# Patient Record
Sex: Male | Born: 2000 | Race: Black or African American | Hispanic: No | Marital: Single | Smoking: Never smoker
Health system: Southern US, Community
[De-identification: ages and names within clinical notes are randomized; demographics above are authoritative.]

---
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
## 2019-02-18 ENCOUNTER — Emergency Department (HOSPITAL_COMMUNITY): Payer: Medicaid Other

## 2019-02-18 ENCOUNTER — Emergency Department (HOSPITAL_COMMUNITY)
Admission: EM | Admit: 2019-02-18 | Discharge: 2019-02-18 | Discharge status: Absent without leave | Payer: Medicaid Other

## 2019-02-18 ENCOUNTER — Emergency Department (HOSPITAL_COMMUNITY)
Admission: EM | Admit: 2019-02-18 | Discharge: 2019-02-18 | Disposition: A | Discharge status: Home / Self Care | Payer: Medicaid Other | Attending: Emergency Medicine | Admitting: Emergency Medicine

## 2019-02-18 ENCOUNTER — Other Ambulatory Visit: Payer: Self-pay

## 2019-02-18 DIAGNOSIS — N130 Hydronephrosis with ureteropelvic junction obstruction: Secondary | ICD-10-CM | POA: Diagnosis not present

## 2019-02-18 DIAGNOSIS — N200 Calculus of kidney: Secondary | ICD-10-CM

## 2019-02-18 DIAGNOSIS — R1031 Right lower quadrant pain: Secondary | ICD-10-CM | POA: Diagnosis present

## 2019-02-18 DIAGNOSIS — N134 Hydroureter: Secondary | ICD-10-CM | POA: Diagnosis not present

## 2019-02-18 LAB — CBC WITH DIFFERENTIAL/PLATELET
Abs Immature Granulocytes: 0.03 10*3/uL (ref 0.00–0.07)
Basophils Absolute: 0 10*3/uL (ref 0.0–0.1)
Basophils Relative: 0 %
Eosinophils Absolute: 0 10*3/uL (ref 0.0–0.5)
Eosinophils Relative: 0 %
HCT: 45.8 % (ref 39.0–52.0)
Hemoglobin: 16 g/dL (ref 13.0–17.0)
Immature Granulocytes: 0 %
Lymphocytes Relative: 13 %
Lymphs Abs: 0.9 10*3/uL (ref 0.7–4.0)
MCH: 33.1 pg (ref 26.0–34.0)
MCHC: 34.9 g/dL (ref 30.0–36.0)
MCV: 94.6 fL (ref 80.0–100.0)
Monocytes Absolute: 0.4 10*3/uL (ref 0.1–1.0)
Monocytes Relative: 6 %
Neutro Abs: 5.4 10*3/uL (ref 1.7–7.7)
Neutrophils Relative %: 81 %
Platelets: 291 10*3/uL (ref 150–400)
RBC: 4.84 MIL/uL (ref 4.22–5.81)
RDW: 11 % — ABNORMAL LOW (ref 11.5–15.5)
WBC: 6.7 10*3/uL (ref 4.0–10.5)
nRBC: 0 % (ref 0.0–0.2)

## 2019-02-18 LAB — URINALYSIS, ROUTINE W REFLEX MICROSCOPIC
Bilirubin Urine: NEGATIVE
Glucose, UA: NEGATIVE mg/dL
Ketones, ur: NEGATIVE mg/dL
Leukocytes,Ua: NEGATIVE
Nitrite: NEGATIVE
Protein, ur: 30 mg/dL — AB
RBC / HPF: >50 RBC/hpf — ABNORMAL HIGH (ref 0–5)
Specific Gravity, Urine: 1.006 (ref 1.005–1.030)
pH: 7 (ref 5.0–8.0)

## 2019-02-18 LAB — COMPREHENSIVE METABOLIC PANEL
ALT: 10 U/L (ref 0–44)
AST: 17 U/L (ref 15–41)
Albumin: 4.4 g/dL (ref 3.5–5.0)
Alkaline Phosphatase: 74 U/L (ref 38–126)
Anion gap: 9 (ref 5–15)
BUN: 11 mg/dL (ref 6–20)
CO2: 26 mmol/L (ref 22–32)
Calcium: 9.7 mg/dL (ref 8.9–10.3)
Chloride: 105 mmol/L (ref 98–111)
Creatinine, Ser: 1.16 mg/dL (ref 0.61–1.24)
GFR calc Af Amer: >60 mL/min (ref >60)
GFR calc non Af Amer: >60 mL/min (ref >60)
Glucose, Bld: 91 mg/dL (ref 70–99)
Potassium: 3.5 mmol/L (ref 3.5–5.1)
Sodium: 140 mmol/L (ref 135–145)
Total Bilirubin: 2.2 mg/dL — ABNORMAL HIGH (ref 0.3–1.2)
Total Protein: 7.6 g/dL (ref 6.5–8.1)

## 2019-02-18 LAB — LIPASE, BLOOD: Lipase: 33 U/L (ref 11–51)

## 2019-02-18 MED ORDER — IOHEXOL 300 MG/ML  SOLN
100.0000 mL | Freq: Once | INTRAMUSCULAR | Status: AC | PRN
  Administered 2019-02-18: 100 mL via INTRAVENOUS

## 2019-02-18 MED ORDER — NAPROXEN 500 MG PO TABS: 500.0000 mg | ORAL_TABLET | Freq: Two times a day (BID) | ORAL | 0 refills | Status: AC | PRN

## 2019-02-18 MED ORDER — SODIUM CHLORIDE 0.9 % IV BOLUS
1000.0000 mL | Freq: Once | INTRAVENOUS | Status: AC
  Administered 2019-02-18: 1000 mL via INTRAVENOUS

## 2019-02-18 MED ORDER — TAMSULOSIN HCL 0.4 MG PO CAPS: 0.4000 mg | ORAL_CAPSULE | Freq: Every day | ORAL | 0 refills | Status: AC

## 2019-02-18 MED ORDER — ONDANSETRON 4 MG PO TBDP: 4.0000 mg | ORAL_TABLET | Freq: Three times a day (TID) | ORAL | 0 refills | Status: AC | PRN

## 2019-02-18 MED ORDER — ONDANSETRON HCL 4 MG/2ML IJ SOLN
4.0000 mg | Freq: Once | INTRAMUSCULAR | Status: AC
  Administered 2019-02-18: 4 mg via INTRAVENOUS
  Filled 2019-02-18: qty 2

## 2019-02-18 MED ORDER — MORPHINE SULFATE (PF) 4 MG/ML IV SOLN
4.0000 mg | Freq: Once | INTRAVENOUS | Status: AC
  Administered 2019-02-18: 4 mg via INTRAVENOUS
  Filled 2019-02-18: qty 1

## 2019-02-18 MED ORDER — OXYCODONE-ACETAMINOPHEN 5-325 MG PO TABS: 1.0000 | ORAL_TABLET | Freq: Three times a day (TID) | ORAL | 0 refills | Status: AC | PRN

## 2019-02-18 NOTE — ED Triage Notes (Signed)
Pt BIB [facility] for abdominal pain that started this morning when he woke up. Pt reports that the pain worsens with standing up. Denies any nausea, vomiting, diarrhea.

## 2019-02-18 NOTE — ED Notes (Signed)
Pt called from lobby with no response x1 

## 2019-02-18 NOTE — Discharge Instructions (Addendum)
You were seen in the emergency department and found to have a kidney stone.  We are sending you home with multiple medications to assist with passing the stone:   -Flomax-this is a medication to help pass the stone, it allows urine to exit the body more freely.  Please take this once daily with a meal.  -Naproxen 500 mg-this is a medication that will help with pain as well as passing the stone.  Please take this every 8 hours.  Take this with food as it can cause stomach upset and at worst stomach bleeding.  Do not take other NSAIDs such as Motrin, Aleve, Advil, Mobic, or ibuprofen with this medicine as they are similar and would propagate any potential side effects.   -Percocet-this is a narcotic/controlled substance medication that has potential addicting qualities.  We recommend that you take 1 tablets every 8 hours as needed for severe pain.  Do not drive or operate heavy machinery when taking this medicine as it can be sedating. Do not drink alcohol or take other sedating medications when taking this medicine for safety reasons.  Keep this out of reach of small children.  Please be aware this medicine has Tylenol in it (325 mg/tab) do not exceed the maximum dose of Tylenol in a day per over the counter recommendations should you decide to supplement with Tylenol over the counter.   -Zofran-this is an antinausea medication, you may take this every 8 hours as needed for nausea and vomiting, please allow the tablet to dissolve underneath of your tongue.   We have prescribed you new medication(s) today. Discuss the medications prescribed today with your pharmacist as they can have adverse effects and interactions with your other medicines including over the counter and prescribed medications. Seek medical evaluation if you start to experience new or abnormal symptoms after taking one of these medicines, seek care immediately if you start to experience difficulty breathing, feeling of your throat closing,  facial swelling, or rash as these could be indications of a more serious allergic reaction  Please follow-up with the urology group provided in your discharge instructions within 3 to 5 days.  Return to the ER for new or worsening symptoms including but not limited to worsening pain not controlled by these medicines, inability to keep fluids down, fever, or any other concerns that you may have.

## 2019-02-18 NOTE — ED Provider Notes (Signed)
[facility]   CSN: [id number] Arrival date & time: 02/18/19  1157     History   Chief Complaint Chief Complaint  Patient presents with   Abdominal Pain    HPI Duane Doyle is a 18 y.o. male no known past medical history presents to emergency department today with chief complaint of abdominal pain, onset was acute starting approximately 3 hours prior to arrival.  Patient states the pain woke him up from his sleep.  He describes the pain as sharp.  Pain is located in right lower quadrant and radiates throughout abdomen.  He rates the pain 8 of 10 in severity.  He states pain is worse with walking and standing up straight.  Did not take anything for pain prior to arrival.  Pain has been constant.  Denies fever, chills, chest pain, shortness of breath, nausea, vomiting, urinary frequency, gross hematuria, diarrhea.  Last p.o. intake was 2 AM this morning.  Denies abdominal surgical history, also denies history of kidney stones.   No past medical history on file.  There are no active problems to display for this patient.   No past surgical history on file.      Home Medications    Prior to Admission medications   Medication Sig Start Date End Date Taking? Authorizing Provider  cetirizine (ZYRTEC) 10 MG chewable tablet Chew 10 mg by mouth daily.    [provider]  ibuprofen (ADVIL,MOTRIN) 800 MG tablet Take 1 tablet (800 mg total) by mouth every 8 (eight) hours as needed for mild pain or moderate pain. 08/14/16   Trixie DredgeWest, Emily, PA-C  naproxen (NAPROSYN) 500 MG tablet Take 1 tablet (500 mg total) by mouth 2 (two) times daily as needed for up to 14 days. 02/18/19 03/04/19  Audrick Lamoureaux E, PA-C  ondansetron (ZOFRAN ODT) 4 MG disintegrating tablet Take 1 tablet (4 mg total) by mouth every 8 (eight) hours as needed for nausea or vomiting. 02/18/19   Kayliee Atienza E, PA-C  oxyCODONE-acetaminophen (PERCOCET/ROXICET) 5-325  MG tablet Take 1 tablet by mouth every 8 (eight) hours as needed for severe pain. 02/18/19   Alwaleed Obeso E, PA-C  tamsulosin (FLOMAX) 0.4 MG CAPS capsule Take 1 capsule (0.4 mg total) by mouth daily after breakfast for 7 days. 02/18/19 02/25/19  Macsen Nuttall, Caroleen HammanKaitlyn E, PA-C    Family History No family history on file.  Social History Social History   Tobacco Use   Smoking status: Never Smoker   Smokeless tobacco: Never Used  Substance Use Topics   Alcohol use: No   Drug use: Yes    Types: Marijuana     Allergies   Patient has no known allergies.   Review of Systems Review of Systems  Constitutional: Negative for chills and fever.  HENT: Negative for congestion, rhinorrhea, sinus pressure and sore throat.   Eyes: Negative for pain and redness.  Respiratory: Negative for cough, shortness of breath and wheezing.   Cardiovascular: Negative for chest pain and palpitations.  Gastrointestinal: Positive for abdominal pain. Negative for constipation, diarrhea, nausea and vomiting.  Genitourinary: Negative for dysuria.  Musculoskeletal: Negative for arthralgias, back pain, myalgias and neck pain.  Skin: Negative for rash and wound.  Neurological: Negative for dizziness, syncope, weakness, numbness and headaches.  Psychiatric/Behavioral: Negative for confusion.     Physical Exam Updated Vital Signs BP 129/74 (BP Location: Right Arm)    Pulse 64    Temp 97.8 F (36.6 C) (Oral)  Resp 16    Ht 5\' 9"  (1.753 m)    Wt 51.7 kg    SpO2 99%    BMI 16.83 kg/m   Physical Exam Vitals signs and nursing note reviewed.  Constitutional:      General: He is not in acute distress.    Appearance: He is not ill-appearing.  HENT:     Head: Normocephalic and atraumatic.     Right Ear: Tympanic membrane and external ear normal.     Left Ear: Tympanic membrane and external ear normal.     Nose: Nose normal.     Mouth/Throat:     Mouth: Mucous membranes are moist.     Pharynx: Oropharynx  is clear.  Eyes:     General: No scleral icterus.       Right eye: No discharge.        Left eye: No discharge.     Extraocular Movements: Extraocular movements intact.     Conjunctiva/sclera: Conjunctivae normal.     Pupils: Pupils are equal, round, and reactive to light.  Neck:     Musculoskeletal: Normal range of motion.     Vascular: No JVD.  Cardiovascular:     Rate and Rhythm: Normal rate and regular rhythm.     Pulses: Normal pulses.          Radial pulses are 2+ on the right side and 2+ on the left side.     Heart sounds: Normal heart sounds.  Pulmonary:     Comments: Lungs clear to auscultation in all fields. Symmetric chest rise. No wheezing, rales, or rhonchi. Abdominal:     General: Bowel sounds are normal.     Tenderness: Negative signs include Murphy's sign, Rovsing's sign, McBurney's sign, psoas sign and obturator sign.     Comments: Abdomen is soft, non-distended.  Generalized abdominal tenderness. Voluntarily guarding in right lower quadrant. No rigidity. No peritoneal signs.  Musculoskeletal: Normal range of motion.  Skin:    General: Skin is warm and dry.     Capillary Refill: Capillary refill takes less than 2 seconds.  Neurological:     Mental Status: He is oriented to person, place, and time.     GCS: GCS eye subscore is 4. GCS verbal subscore is 5. GCS motor subscore is 6.     Comments: Fluent speech, no facial droop.  Psychiatric:        Behavior: Behavior normal.      ED Treatments / Results  Labs (all labs ordered are listed, but only abnormal results are displayed) Labs Reviewed  COMPREHENSIVE METABOLIC PANEL - Abnormal; Notable for the following components:      Result Value   Total Bilirubin 2.2 (*)    All other components within normal limits  CBC WITH DIFFERENTIAL/PLATELET - Abnormal; Notable for the following components:   RDW 11.0 (*)    All other components within normal limits  URINALYSIS, ROUTINE W REFLEX MICROSCOPIC - Abnormal;  Notable for the following components:   Color, Urine AMBER (*)    Hgb urine dipstick LARGE (*)    Protein, ur 30 (*)    RBC / HPF >50 (*)    Bacteria, UA RARE (*)    All other components within normal limits  LIPASE, BLOOD    EKG None  Radiology Ct Abdomen Pelvis W Contrast  Result Date: 02/18/2019 CLINICAL DATA:  Mid abdominal pain, radiating in right flank area EXAM: CT ABDOMEN AND PELVIS WITH CONTRAST TECHNIQUE: Multidetector CT imaging of  the abdomen and pelvis was performed using the standard protocol following bolus administration of intravenous contrast. CONTRAST:  OMNIPAQUE IOHEXOL 300 MG/ML  SOLN COMPARISON:  None. FINDINGS: Lower chest: No acute abnormality. Hepatobiliary: No solid liver abnormality is seen. No gallstones, gallbladder wall thickening, or biliary dilatation. Pancreas: Unremarkable. No pancreatic ductal dilatation or surrounding inflammatory changes. Spleen: Normal in size without significant abnormality. Adrenals/Urinary Tract: Adrenal glands are unremarkable. There is minimal right hydronephrosis and hydroureter with a tiny, 2 mm calculus near the right ureterovesicular junction (series 3, image 67, series 6, image 64). Bladder is unremarkable. Stomach/Bowel: Stomach is within normal limits. Appendix appears normal. No evidence of bowel wall thickening, distention, or inflammatory changes. Large burden of stool in the right colon. Vascular/Lymphatic: No significant vascular findings are present. No enlarged abdominal or pelvic lymph nodes. Reproductive: No mass or other significant abnormality. Other: No abdominal wall hernia or abnormality. No abdominopelvic ascites. Musculoskeletal: No acute or significant osseous findings. IMPRESSION: 1. There is minimal right hydronephrosis and hydroureter with a tiny, 2 mm calculus near the right ureterovesicular junction (series 3, image 67, series 6, image 64). 2.  Normal appendix. Electronically Signed   By: Lauralyn Primes M.D.    On: 02/18/2019 14:56    Procedures Procedures (including critical care time)  Medications Ordered in ED Medications  morphine 4 MG/ML injection 4 mg (4 mg Intravenous Given 02/18/19 1300)  sodium chloride 0.9 % bolus 1,000 mL (1,000 mLs Intravenous New Bag/Given 02/18/19 1259)  ondansetron (ZOFRAN) injection 4 mg (4 mg Intravenous Given 02/18/19 1300)  iohexol (OMNIPAQUE) 300 MG/ML solution 100 mL (100 mLs Intravenous Contrast Given 02/18/19 1405)     Initial Impression / Assessment and Plan / ED Course  I have reviewed the triage vital signs and the nursing notes.  Pertinent labs & imaging results that were available during my care of the patient were reviewed by me and considered in my medical decision making (see chart for details).  Patient presents to the ED with complaints of abdominal pain. Patient nontoxic appearing, in no apparent distress, vitals WNL. On exam patient with diffuse abdominal tenderness that is worse in the right lower quadrant.  No peritoneal signs.  No CVA tenderness.  Will evaluate with labs and CT A/P given he has tenderness to palpation right lower quadrant concerning for possible appendicitis.. Analgesics, anti-emetics, and fluids administered.   Labs reviewed and grossly unremarkable. No leukocytosis, no anemia, no significant electrolyte derangements. LFTs, renal function, and lipase WNL. Urinalysis without obvious infection, but does have large blood and over 50 RBCs consistent with a possible kidney stone Imaging shows normal appendix and minimal right hydronephrosis and hydroureter with a 2 mm stone near the right ureterovesicular junction.  On reassessment patient reports pain is significantly improved.  He is tolerating p.o. intake.. Vitals sign stable and the pt does not have irratractable vomiting. Pt will be dc home with pain medications & has been advised to follow up with PCP. PDMP reviewed during this encounter.  Has no recent narcotic  prescriptions.  Portions of this note were generated with Scientist, clinical (histocompatibility and immunogenetics). Dictation errors may occur despite best attempts at proofreading.   Final Clinical Impressions(s) / ED Diagnoses   Final diagnoses:  Kidney stone    ED Discharge Orders         Ordered    ondansetron (ZOFRAN ODT) 4 MG disintegrating tablet  Every 8 hours PRN     02/18/19 1612    oxyCODONE-acetaminophen (PERCOCET/ROXICET) 5-325 MG  tablet  Every 8 hours PRN     02/18/19 1612    naproxen (NAPROSYN) 500 MG tablet  2 times daily PRN     02/18/19 1612    tamsulosin (FLOMAX) 0.4 MG CAPS capsule  Daily after breakfast     02/18/19 1612           Jeromie Gainor, Caroleen Hamman, PA-C 02/18/19 1627    Virgina Norfolk, DO 02/18/19 1846

## 2019-02-18 NOTE — ED Notes (Signed)
Pt tolerating PO challenge well. Pt denies any nausea/vomiting.

## 2020-09-01 ENCOUNTER — Emergency Department (HOSPITAL_COMMUNITY): Payer: Medicaid Other

## 2020-09-01 ENCOUNTER — Other Ambulatory Visit: Payer: Self-pay

## 2020-09-01 ENCOUNTER — Emergency Department (HOSPITAL_COMMUNITY)
Admission: EM | Admit: 2020-09-01 | Discharge: 2020-09-02 | Disposition: A | Discharge status: Home / Self Care | Payer: Medicaid Other | Attending: Emergency Medicine | Admitting: Emergency Medicine

## 2020-09-01 ENCOUNTER — Encounter (HOSPITAL_COMMUNITY): Payer: Self-pay

## 2020-09-01 DIAGNOSIS — Y92410 Unspecified street and highway as the place of occurrence of the external cause: Secondary | ICD-10-CM | POA: Diagnosis not present

## 2020-09-01 DIAGNOSIS — S39012A Strain of muscle, fascia and tendon of lower back, initial encounter: Secondary | ICD-10-CM | POA: Insufficient documentation

## 2020-09-01 DIAGNOSIS — S20219A Contusion of unspecified front wall of thorax, initial encounter: Secondary | ICD-10-CM | POA: Diagnosis not present

## 2020-09-01 DIAGNOSIS — S299XXA Unspecified injury of thorax, initial encounter: Secondary | ICD-10-CM | POA: Diagnosis present

## 2020-09-01 MED ORDER — ACETAMINOPHEN 325 MG PO TABS
650.0000 mg | ORAL_TABLET | Freq: Once | ORAL | Status: AC
  Administered 2020-09-01: 650 mg via ORAL
  Filled 2020-09-01: qty 2
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
## 2020-09-02 MED ORDER — ACETAMINOPHEN 325 MG PO TABS
650.0000 mg | ORAL_TABLET | Freq: Once | ORAL | Status: AC
  Administered 2020-09-02: 650 mg via ORAL
  Filled 2020-09-02: qty 2

## 2020-09-02 MED ORDER — ORPHENADRINE CITRATE ER 100 MG PO TB12: 100.0000 mg | ORAL_TABLET | Freq: Two times a day (BID) | ORAL | 0 refills | Status: AC

## 2020-09-02 MED ORDER — NAPROXEN 500 MG PO TABS: 500.0000 mg | ORAL_TABLET | Freq: Two times a day (BID) | ORAL | 0 refills | Status: AC

## 2020-09-02 MED ORDER — CYCLOBENZAPRINE HCL 10 MG PO TABS
10.0000 mg | ORAL_TABLET | Freq: Once | ORAL | Status: AC
  Administered 2020-09-02: 10 mg via ORAL
  Filled 2020-09-02: qty 1

## 2020-09-02 MED ORDER — NAPROXEN 250 MG PO TABS
500.0000 mg | ORAL_TABLET | Freq: Once | ORAL | Status: AC
  Administered 2020-09-02: 500 mg via ORAL
  Filled 2020-09-02: qty 2
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
## 2020-09-02 NOTE — Discharge Instructions (Addendum)
Apply ice for 30 minutes at a time, 4 times a day.  Take acetaminophen as needed to get additional pain relief.
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
